# Patient Record
Sex: Male | Born: 1990 | Race: White | Hispanic: No | Marital: Married | Smoking: Current every day smoker
Health system: Southern US, Community
[De-identification: ages and names within clinical notes are randomized; demographics above are authoritative.]

## PROBLEM LIST (undated history)

## (undated) DIAGNOSIS — K219 Gastro-esophageal reflux disease without esophagitis: Secondary | ICD-10-CM

---

## 2015-07-23 ENCOUNTER — Encounter (HOSPITAL_BASED_OUTPATIENT_CLINIC_OR_DEPARTMENT_OTHER): Payer: Self-pay

## 2015-07-23 ENCOUNTER — Emergency Department (HOSPITAL_BASED_OUTPATIENT_CLINIC_OR_DEPARTMENT_OTHER): Payer: Managed Care, Other (non HMO)

## 2015-07-23 ENCOUNTER — Emergency Department (HOSPITAL_BASED_OUTPATIENT_CLINIC_OR_DEPARTMENT_OTHER)
Admission: EM | Admit: 2015-07-23 | Discharge: 2015-07-23 | Disposition: A | Discharge status: Home / Self Care | Payer: Managed Care, Other (non HMO) | Attending: Emergency Medicine | Admitting: Emergency Medicine

## 2015-07-23 DIAGNOSIS — R079 Chest pain, unspecified: Secondary | ICD-10-CM | POA: Diagnosis not present

## 2015-07-23 DIAGNOSIS — Z79899 Other long term (current) drug therapy: Secondary | ICD-10-CM | POA: Diagnosis not present

## 2015-07-23 DIAGNOSIS — F17210 Nicotine dependence, cigarettes, uncomplicated: Secondary | ICD-10-CM | POA: Diagnosis not present

## 2015-07-23 HISTORY — DX: Gastro-esophageal reflux disease without esophagitis: K21.9

## 2015-07-23 LAB — BASIC METABOLIC PANEL
ANION GAP: 7 (ref 5–15)
BUN: 20 mg/dL (ref 6–20)
CALCIUM: 9.3 mg/dL (ref 8.9–10.3)
CO2: 28 mmol/L (ref 22–32)
Chloride: 102 mmol/L (ref 101–111)
Creatinine, Ser: 0.72 mg/dL (ref 0.61–1.24)
Glucose, Bld: 74 mg/dL (ref 65–99)
Potassium: 3.8 mmol/L (ref 3.5–5.1)
SODIUM: 137 mmol/L (ref 135–145)

## 2015-07-23 LAB — CBC
HCT: 46.4 % (ref 39.0–52.0)
HEMOGLOBIN: 16.4 g/dL (ref 13.0–17.0)
MCH: 30.7 pg (ref 26.0–34.0)
MCHC: 35.3 g/dL (ref 30.0–36.0)
MCV: 86.9 fL (ref 78.0–100.0)
PLATELETS: 275 10*3/uL (ref 150–400)
RBC: 5.34 MIL/uL (ref 4.22–5.81)
RDW: 12.4 % (ref 11.5–15.5)
WBC: 7.2 10*3/uL (ref 4.0–10.5)

## 2015-07-23 LAB — TROPONIN I

## 2015-07-23 NOTE — Discharge Instructions (Signed)
There is not appear to be an emergent cause for your discomfort at this time. However, it is important feet follow-up with cardiology. Please call their office in the next 1-2 days for reevaluation. Your labs, chest x-ray, EKG were all reassuring. Please wear a mask while exposed to potentially harmful chemicals. Return to ED for any new or worsening symptoms as we discussed.

## 2015-07-23 NOTE — ED Notes (Signed)
Pt sitting up in bed watching TV. NAD, skin W&D. Pt c/o ache in his L chest that is rated a "5"/10. Pt advised of plan and has no requests at this time.

## 2015-07-23 NOTE — ED Provider Notes (Signed)
CSN: [id number]     Arrival date & time 07/23/15  1715 History   First MD Initiated Contact with Patient 07/23/15 1719     Chief Complaint  Patient presents with  . Chest Pain     (Consider location/radiation/quality/duration/timing/severity/associated sxs/prior Treatment) HPI Aldean AstZachary Mcwhirt is a 25 y.o. male who is a current half pack per day cigarette smoker comes in for evaluation of chest pain. Patient reports over the past 2 weeks he has had gradually worsening chest discomfort with associated shortness of breath. He reports that he has the discomfort at baseline, but when he is active, he will experience worsening chest discomfort that will last 5 or 10 minutes and then resolve with rest. Characterized as an "ache". He reports mild associated shortness of breath. He also reports discomfort is worse with deep respiration.  He denies any fevers, chills, cough, nausea or vomiting, diaphoresis, radiation of discomfort, leg swelling, hemoptysis. No history of blood clot or exogenous estrogen use. No history of hypertension, hyperlipidemia, diabetes or family history of cardiac disease.   Past Medical History  Diagnosis Date  . GERD (gastroesophageal reflux disease)    History reviewed. No pertinent past surgical history. No family history on file. Social History  Substance Use Topics  . Smoking status: Current Every Day Smoker -- 0.50 packs/day    Types: Cigarettes  . Smokeless tobacco: None  . Alcohol Use: No    Review of Systems A 10 point review of systems was completed and was negative except for pertinent positives and negatives as mentioned in the history of present illness     Allergies  Review of patient's allergies indicates no known allergies.  Home Medications   Prior to Admission medications   Medication Sig Start Date End Date Taking? Authorizing Provider  fexofenadine (ALLEGRA) 30 MG tablet Take 30 mg by mouth 2 (two) times daily.   Yes Historical Provider, MD   omeprazole (PRILOSEC) 20 MG capsule Take 20 mg by mouth daily.   Yes Historical Provider, MD   BP 119/76 mmHg  Pulse 58  Temp(Src) 98.7 F (37.1 C)  Resp 11  Ht 6' (1.829 m)  Wt 103.874 kg  BMI 31.05 kg/m2  SpO2 100% Physical Exam  Constitutional: He is oriented to person, place, and time. He appears well-developed and well-nourished. No distress.  HENT:  Head: Normocephalic and atraumatic.  Mouth/Throat: Oropharynx is clear and moist.  Eyes: Conjunctivae are normal. Pupils are equal, round, and reactive to light. Right eye exhibits no discharge. Left eye exhibits no discharge. No scleral icterus.  Neck: Normal range of motion. Neck supple.  Cardiovascular: Normal rate, regular rhythm and normal heart sounds.   Pulmonary/Chest: Effort normal and breath sounds normal. No respiratory distress. He has no wheezes. He has no rales.  Abdominal: Soft. There is no tenderness.  Musculoskeletal: Normal range of motion. He exhibits no edema or tenderness.  Neurological: He is alert and oriented to person, place, and time.  Cranial Nerves II-XII grossly intact  Skin: Skin is warm and dry. No rash noted. He is not diaphoretic.  Psychiatric: He has a normal mood and affect.  Nursing note and vitals reviewed.   ED Course  Procedures (including critical care time) Labs Review Labs Reviewed  BASIC METABOLIC PANEL  CBC  TROPONIN I    Imaging Review Dg Chest 2 View  07/23/2015  CLINICAL DATA:  Left anterior chest pain for 2 weeks EXAM: CHEST  2 VIEW COMPARISON:  None. FINDINGS: The heart size and  mediastinal contours are within normal limits. Both lungs are clear. The visualized skeletal structures are unremarkable. IMPRESSION: No active cardiopulmonary disease. Electronically Signed   By: Alcide Clever M.D.   On: 07/23/2015 17:39   I have personally reviewed and evaluated these images and lab results as part of my medical decision-making.   EKG Interpretation   Date/Time:  Wednesday July 23 2015 17:20:57 EDT Ventricular Rate:  65 PR Interval:  124 QRS Duration: 108 QT Interval:  378 QTC Calculation: 393 R Axis:   58 Text Interpretation:  Normal sinus rhythm Normal ECG No previous ECGs  available Confirmed by NGUYEN, EMILY ([id number]) on 07/23/2015 7:14:50 PM     Filed Vitals:   07/23/15 1900 07/23/15 2000  BP: 124/82 119/76  Pulse: 57 58  Temp:    Resp: 18 11    MDM  Patient presents for evaluation of chest pain over the past 2 weeks. Heart score 1, ECG completely normal. Reports that discomfort is exertional, maintains a pleuritic component. Doubt pulmonary embolus-PERC negative. Chest x-ray, screening labs and troponin are all negative. Given persistent nature of discomfort over 2 weeks, I do not feel delta troponin is necessary.  His wife arrives in the emergency department and reports the patient is exposed to chemicals throughout the day and he is constantly inhaling these chemicals. Symptoms may be secondary to a pneumonitis-recommended he wear appropriate respirator during work. However, due to exertional complaint, we'll have patient follow-up with cardiology for further evaluation and management of symptoms. Discussed with my attending, Dr. Cyndie Chime. Discussed strict return precautions, he verbalizes understanding and agrees with plan as well as subsequent discharge.  Final diagnoses:  Chest pain, unspecified       Joycie Peek, PA-C 07/23/15 2028  Leta Baptist, MD 07/28/15 2329

## 2015-07-23 NOTE — ED Notes (Signed)
Patient complains of left anterior chest pain x 2 weeks. States it is an aching pain with shortness of breath, worse with exertion. Denies trauma.

## 2015-07-27 NOTE — Progress Notes (Signed)
Cardiology Office Note   Date:  07/28/2015   ID:  Andre Palmer, DOB Dec 22, 1990, MRN [id number]  PCP:  Pcp Not In System  Cardiologist:   Will Jorja Loa, MD    Chief Complaint  Patient presents with  . New Patient (Initial Visit)     History of Present Illness: Andre Palmer is a 25 y.o. male who presents today for cardiology evaluation.   Presented to the ER with chest pain.  Smokes 0.5 ppd.  Worsening CP over the last 2 weeks associated with SOB.  Discomfort at baseline but worse when active.  Lasts 5-10 minutes and resolves with rest.  Feels like an ache.  Worse with deep respiration.  In the ER, ECG normal, troponin negative x1.  Exposed to chemicals during the day, thought symptoms possibly due to pneumonitis.     Today, he denies symptoms of palpitations, orthopnea, PND, lower extremity edema, claudication, dizziness, presyncope, syncope, bleeding, or neurologic sequela. The patient is tolerating medications without difficulties and is otherwise without complaint today. He does continue to have mild chest pain. He says that he has not been working with chemicals at work over the last week, and his symptoms have been improving. He does say that exertion makes the symptoms worse at rest makes him feel better.   Past Medical History  Diagnosis Date  . GERD (gastroesophageal reflux disease)    No past surgical history on file.   Current Outpatient Prescriptions  Medication Sig Dispense Refill  . fexofenadine (ALLEGRA) 30 MG tablet Take 30 mg by mouth 2 (two) times daily.    Marland Kitchen omeprazole (PRILOSEC) 20 MG capsule Take 20 mg by mouth daily.     No current facility-administered medications for this visit.    Allergies:   Review of patient's allergies indicates no known allergies.   Social History:  The patient  reports that he has been smoking Cigarettes.  He has been smoking about 0.50 packs per day. He does not have any smokeless tobacco history on file. He reports that  he does not drink alcohol.   Family History:  The patient's family history includes Cancer in his paternal grandmother; Heart attack in his maternal grandmother.    ROS:  Please see the history of present illness.   Otherwise, review of systems is positive for DOE, chest pain.   All other systems are reviewed and negative.    PHYSICAL EXAM: VS:  BP 120/72 mmHg  Pulse 79  Ht  (1.803 m)  Wt 235 lb 6.4 oz (106.777 kg)  BMI 32.85 kg/m2 , BMI Body mass index is 32.85 kg/(m^2). GEN: Well nourished, well developed, in no acute distress HEENT: normal Neck: no JVD, carotid bruits, or masses Cardiac: RRR; no murmurs, rubs, or gallops,no edema  Respiratory:  clear to auscultation bilaterally, normal work of breathing GI: soft, nontender, nondistended, + BS MS: no deformity or atrophy Skin: warm and dry Neuro:  Strength and sensation are intact Psych: euthymic mood, full affect  EKG:  EKG is ordered today. The ekg ordered today shows sinus rhythm, rate 79  Recent Labs: 07/23/2015: BUN 20; Creatinine, Ser 0.72; Hemoglobin 16.4; Platelets 275; Potassium 3.8; Sodium 137    Lipid Panel  No results found for: CHOL, TRIG, HDL, CHOLHDL, VLDL, LDLCALC, LDLDIRECT   Wt Readings from Last 3 Encounters:  07/28/15 235 lb 6.4 oz (106.777 kg)  07/23/15 229 lb (103.874 kg)      Other studies Reviewed: Additional studies/ records that were reviewed today  include: Epic notes   ASSESSMENT AND PLAN:  1.  Chest pain: Chest pain today has some very typical symptoms of worsening with exertion, and improved with rest. He does have chest pain that is constant, which is quite atypical for cardiac related chest pain. He also has no risk factors for coronary disease. Based on the exertional nature of his chest pain, we'll order an exercise treadmill test to determine if he does have coronary causes for his chest pain.    Current medicines are reviewed at length with the patient today.   The patient  does not have concerns regarding his medicines.  The following changes were made today:  none  Labs/ tests ordered today include:  No orders of the defined types were placed in this encounter.     Disposition:   FU with Will Camnitz pending ETT results  Signed, Will Jorja LoaMartin Camnitz, MD  07/28/2015 11:10 AM     [facility] [address] [place] [address] [phone] (office) [phone] (fax)

## 2015-07-28 ENCOUNTER — Encounter: Payer: Self-pay | Admitting: Cardiology

## 2015-07-28 ENCOUNTER — Encounter: Payer: Self-pay | Admitting: *Deleted

## 2015-07-28 ENCOUNTER — Ambulatory Visit (INDEPENDENT_AMBULATORY_CARE_PROVIDER_SITE_OTHER): Payer: Managed Care, Other (non HMO) | Admitting: Cardiology

## 2015-07-28 VITALS — BP 120/72 | HR 79 | Ht 71.0 in | Wt 235.4 lb

## 2015-07-28 DIAGNOSIS — R079 Chest pain, unspecified: Secondary | ICD-10-CM

## 2015-07-28 NOTE — Patient Instructions (Addendum)
Medication Instructions:  Your physician recommends that you continue on your current medications as directed. Please refer to the Current Medication list given to you today.  Labwork: None ordered  Testing/Procedures: Your physician has requested that you have an exercise tolerance test. For further information please visit [email or web address]. Please also follow instruction sheet, as given.  Follow-Up: To be determined once exercise tolerance testing has been reviewed by the physician.  We will call you with the results.  If you need a refill on your cardiac medications before your next appointment, please call your pharmacy.  Thank you for choosing [facility]!!   Dory HornSherri Lean Jaeger, RN [phone]  Any Other Special Instructions Will Be Listed Below (If Applicable). Exercise Stress Electrocardiogram An exercise stress electrocardiogram is a test that is done to evaluate the blood supply to your heart. This test may also be called exercise stress electrocardiography. The test is done while you are walking on a treadmill. The goal of this test is to raise your heart rate. This test is done to find areas of poor blood flow to the heart by determining the extent of coronary artery disease (CAD).   CAD is defined as narrowing in one or more heart (coronary) arteries of more than 70%. If you have an abnormal test result, this may mean that you are not getting adequate blood flow to your heart during exercise. Additional testing may be needed to understand why your test was abnormal. LET [facility] KNOW ABOUT:   Any allergies you have.  All medicines you are taking, including vitamins, herbs, eye drops, creams, and over-the-counter medicines.  Previous problems you or members of your family have had with the use of anesthetics.  Any blood disorders you have.  Previous surgeries you have had.  Medical conditions you have.  Possibility of pregnancy, if this  applies. RISKS AND COMPLICATIONS Generally, this is a safe procedure. However, as with any procedure, complications can occur. Possible complications can include:  Pain or pressure in the following areas:  Chest.  Jaw or neck.  Between your shoulder blades.  Radiating down your left arm.  Dizziness or light-headedness.  Shortness of breath.  Increased or irregular heartbeats.  Nausea or vomiting.  Heart attack (rare). BEFORE THE PROCEDURE  Avoid all forms of caffeine 24 hours before your test or as directed by your health care provider. This includes coffee, tea (even decaffeinated tea), caffeinated sodas, chocolate, cocoa, and certain pain medicines.  Follow your health care provider's instructions regarding eating and drinking before the test.  Take your medicines as directed at regular times with water unless instructed otherwise. Exceptions may include:  If you have diabetes, ask how you are to take your insulin or pills. It is common to adjust insulin dosing the morning of the test.  If you are taking beta-blocker medicines, it is important to talk to your health care provider about these medicines well before the date of your test. Taking beta-blocker medicines may interfere with the test. In some cases, these medicines need to be changed or stopped 24 hours or more before the test.  If you wear a nitroglycerin patch, it may need to be removed prior to the test. Ask your health care provider if the patch should be removed before the test.  If you use an inhaler for any breathing condition, bring it with you to the test.  If you are an outpatient, bring a snack so you can eat right after the stress phase  of the test.  Do not smoke for 4 hours prior to the test or as directed by your health care provider.  Do not apply lotions, powders, creams, or oils on your chest prior to the test.  Wear loose-fitting clothes and comfortable shoes for the test. This test involves  walking on a treadmill. PROCEDURE  Multiple patches (electrodes) will be put on your chest. If needed, small areas of your chest may have to be shaved to get better contact with the electrodes. Once the electrodes are attached to your body, multiple wires will be attached to the electrodes and your heart rate will be monitored.  Your heart will be monitored both at rest and while exercising.  You will walk on a treadmill. The treadmill will be started at a slow pace. The treadmill speed and incline will gradually be increased to raise your heart rate. AFTER THE PROCEDURE  Your heart rate and blood pressure will be monitored after the test.  You may return to your normal schedule including diet, activities, and medicines, unless your health care provider tells you otherwise.   This information is not intended to replace advice given to you by your health care provider. Make sure you discuss any questions you have with your health care provider.   Document Released: 01/30/2000 Document Revised: 02/06/2013 Document Reviewed: 10/09/2012 Elsevier Interactive Patient Education [employer]

## 2017-10-25 IMAGING — CR DG CHEST 2V
2 series · 2 of 2 positions shown · non-contrast
Comparison: None.

CLINICAL DATA: Left anterior chest pain for 2 weeks

EXAM:
CHEST  2 VIEW

[w chest pa]
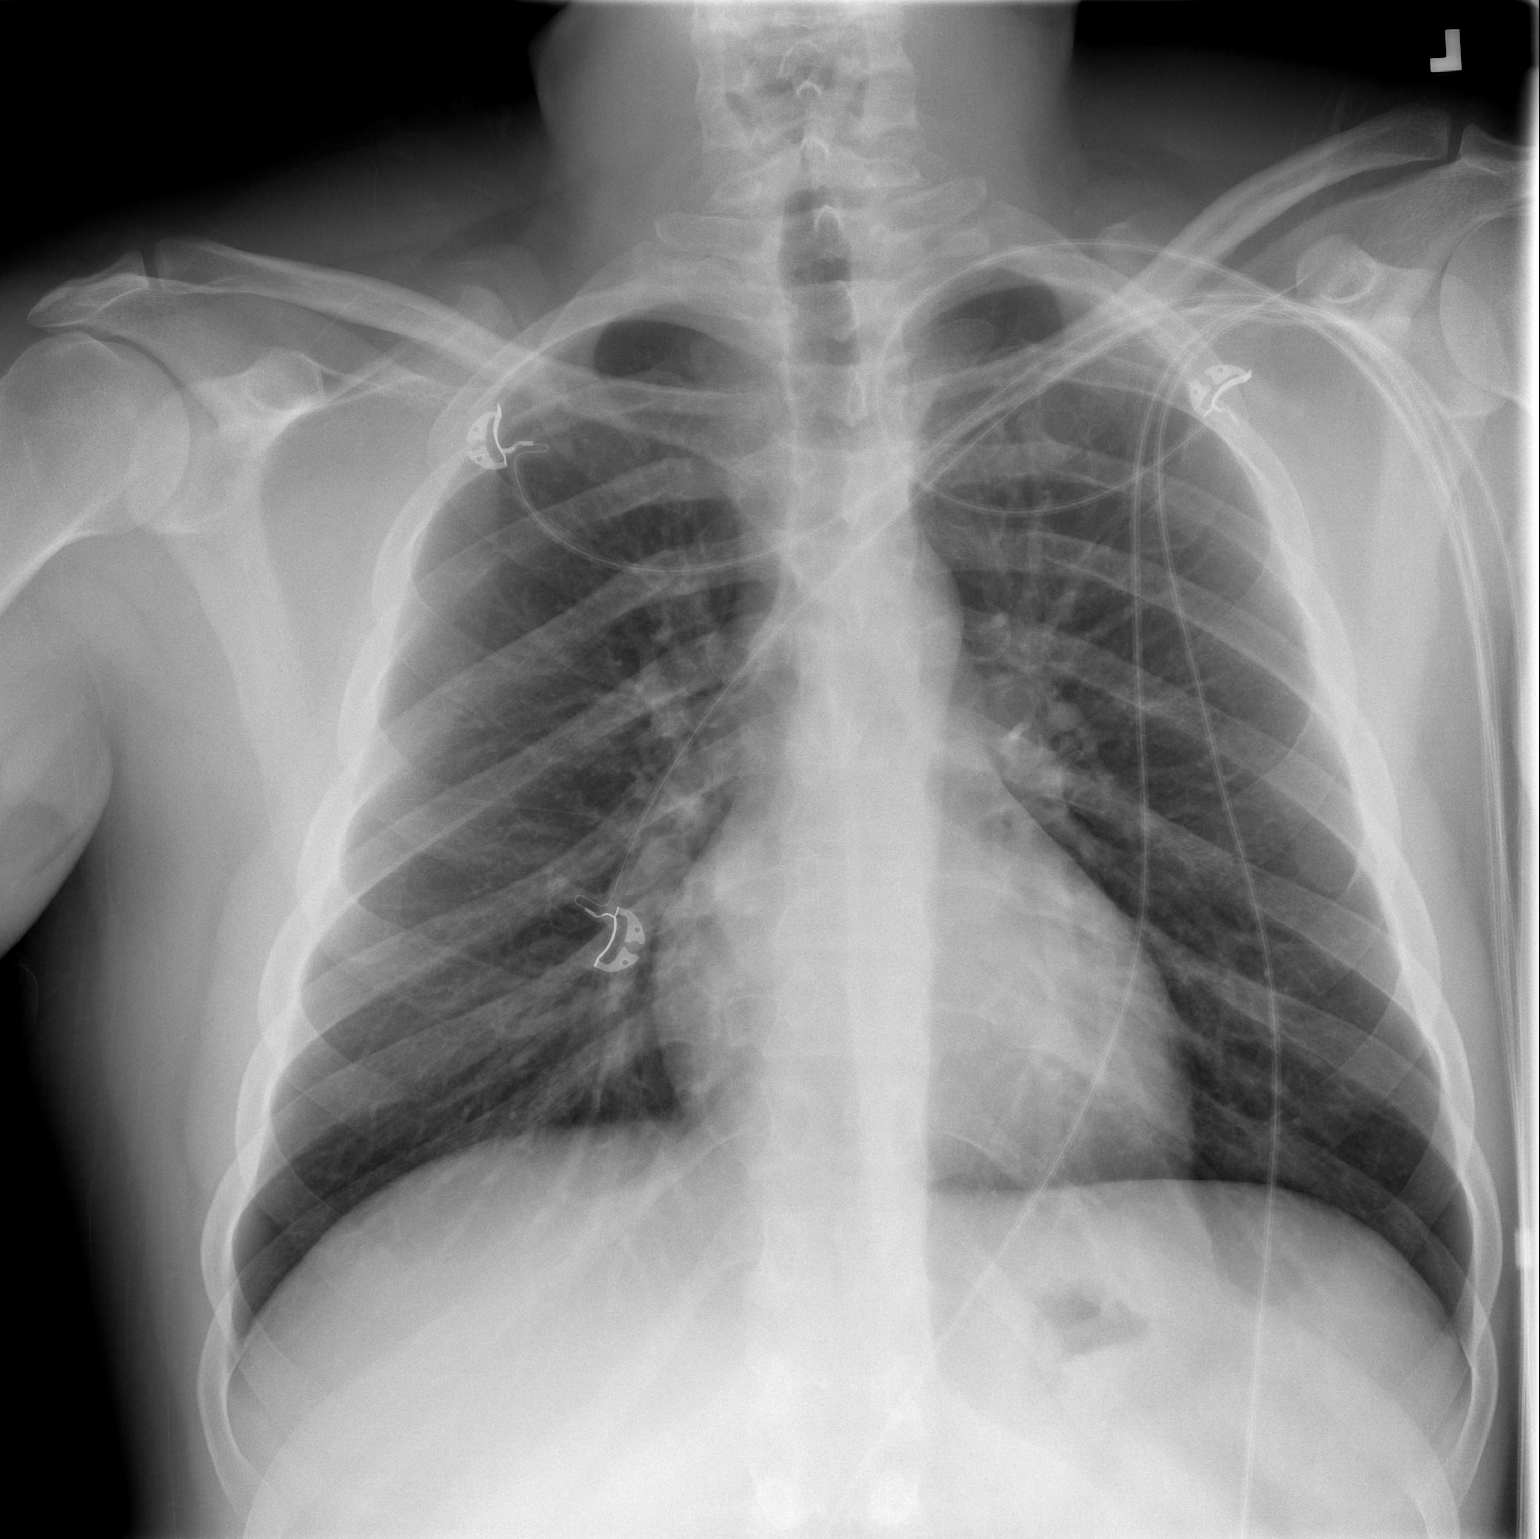

[w chest lat]
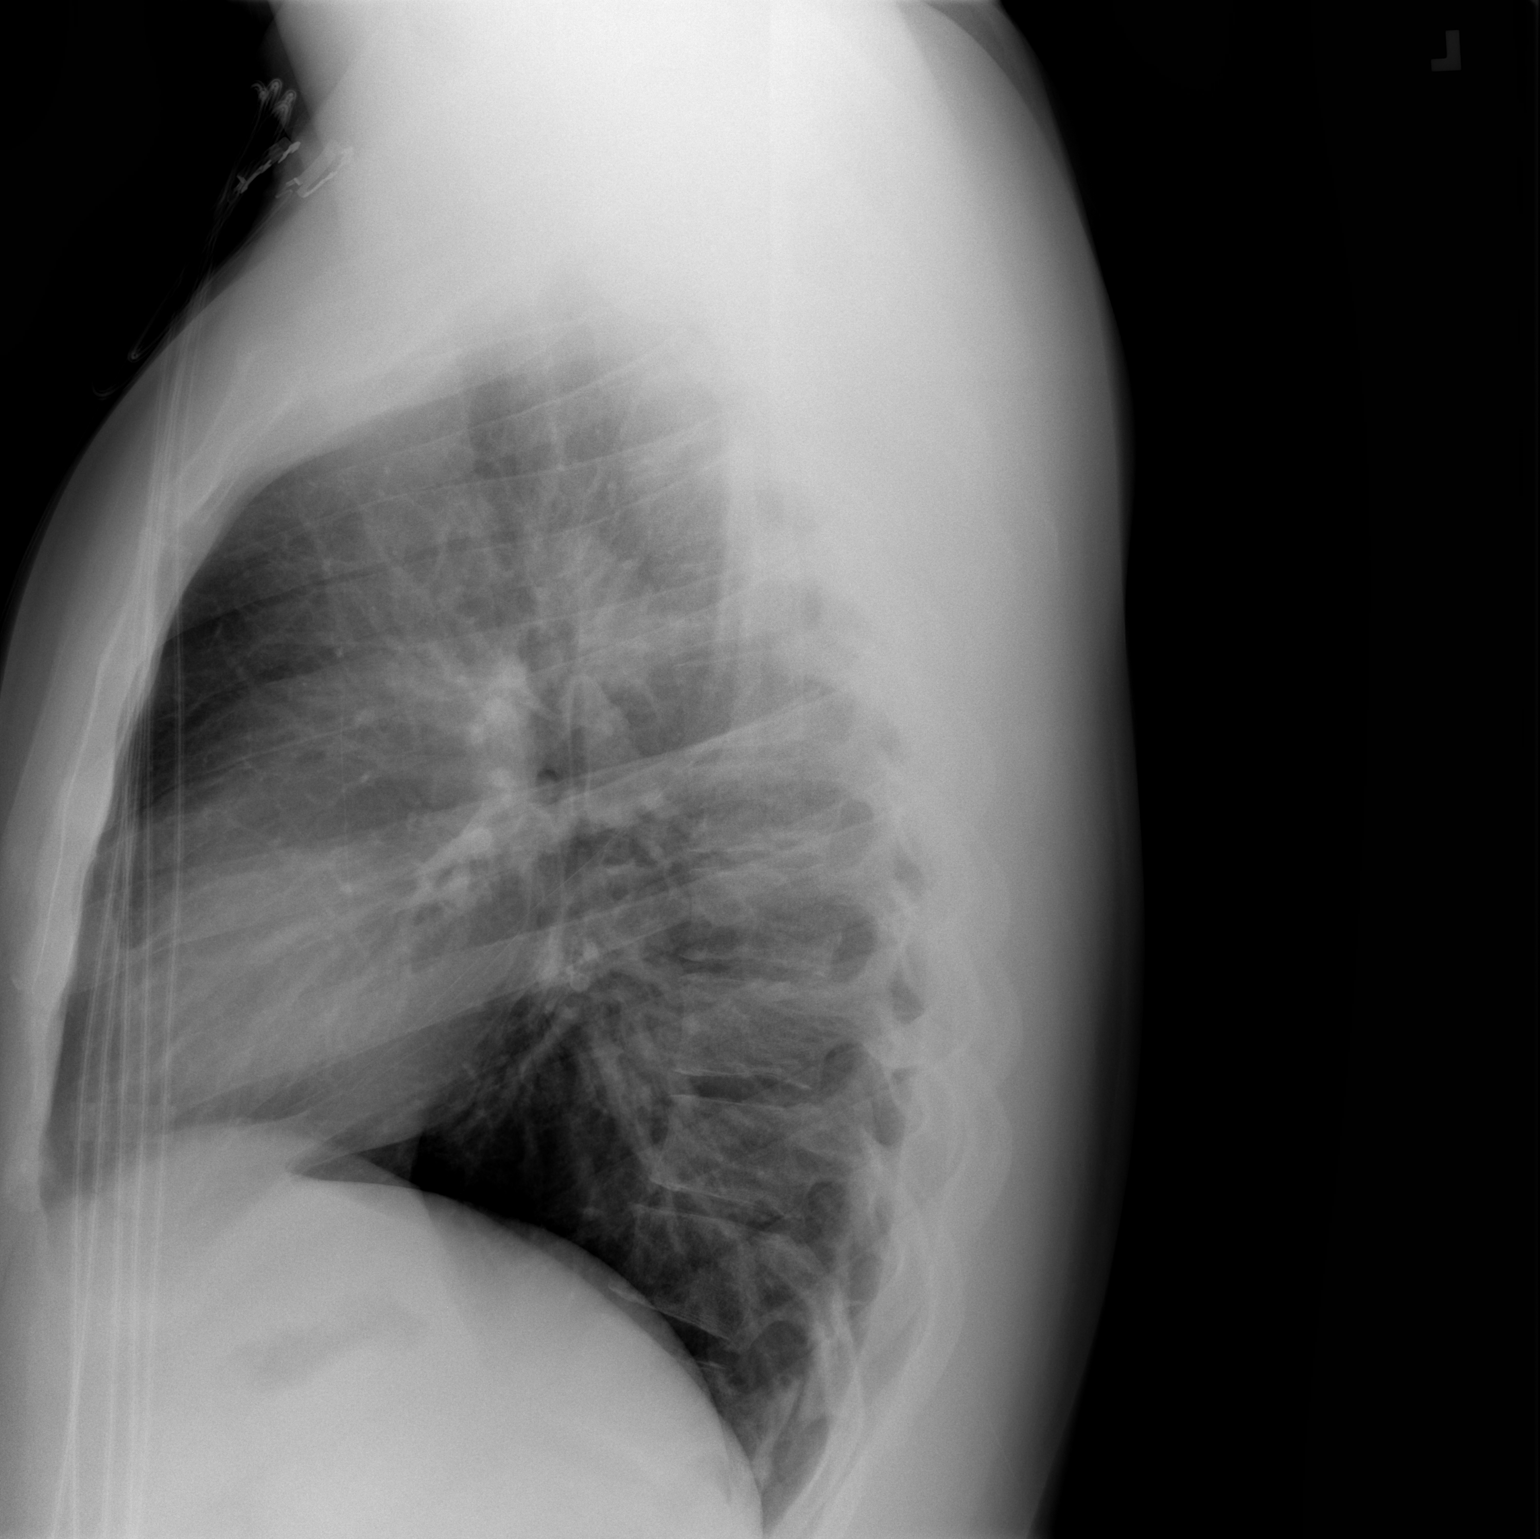

[2 of 2 positions shown; findings below may reference images not displayed]

FINDINGS: The heart size and mediastinal contours are within normal limits.
Both lungs are clear. The visualized skeletal structures are
unremarkable.
IMPRESSION: No active cardiopulmonary disease.
